# Patient Record
Sex: Male | Born: 2000 | Race: White | Hispanic: No | Marital: Single | Smoking: Never smoker
Health system: Southern US, Community
[De-identification: ages and names within clinical notes are randomized; demographics above are authoritative.]

---
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
## 2022-05-02 DIAGNOSIS — Z0000 Encounter for general adult medical examination without abnormal findings: Secondary | ICD-10-CM | POA: Diagnosis not present

## 2022-05-08 DIAGNOSIS — Z0000 Encounter for general adult medical examination without abnormal findings: Secondary | ICD-10-CM | POA: Diagnosis not present

## 2022-05-08 DIAGNOSIS — L659 Nonscarring hair loss, unspecified: Secondary | ICD-10-CM | POA: Diagnosis not present

## 2022-05-08 DIAGNOSIS — R82998 Other abnormal findings in urine: Secondary | ICD-10-CM | POA: Diagnosis not present

## 2022-09-27 DIAGNOSIS — S62346A Nondisplaced fracture of base of fifth metacarpal bone, right hand, initial encounter for closed fracture: Secondary | ICD-10-CM | POA: Diagnosis not present

## 2022-10-11 DIAGNOSIS — S62346A Nondisplaced fracture of base of fifth metacarpal bone, right hand, initial encounter for closed fracture: Secondary | ICD-10-CM | POA: Diagnosis not present

## 2022-10-11 DIAGNOSIS — M79641 Pain in right hand: Secondary | ICD-10-CM | POA: Diagnosis not present

## 2022-11-08 DIAGNOSIS — S62346A Nondisplaced fracture of base of fifth metacarpal bone, right hand, initial encounter for closed fracture: Secondary | ICD-10-CM | POA: Diagnosis not present

## 2023-03-20 DIAGNOSIS — M542 Cervicalgia: Secondary | ICD-10-CM | POA: Diagnosis not present

## 2023-03-20 DIAGNOSIS — R519 Headache, unspecified: Secondary | ICD-10-CM | POA: Diagnosis not present

## 2023-05-03 DIAGNOSIS — R7989 Other specified abnormal findings of blood chemistry: Secondary | ICD-10-CM | POA: Diagnosis not present

## 2023-05-21 DIAGNOSIS — Z1331 Encounter for screening for depression: Secondary | ICD-10-CM | POA: Diagnosis not present

## 2023-05-21 DIAGNOSIS — Z1339 Encounter for screening examination for other mental health and behavioral disorders: Secondary | ICD-10-CM | POA: Diagnosis not present

## 2023-05-21 DIAGNOSIS — Z23 Encounter for immunization: Secondary | ICD-10-CM | POA: Diagnosis not present

## 2023-05-21 DIAGNOSIS — Z0000 Encounter for general adult medical examination without abnormal findings: Secondary | ICD-10-CM | POA: Diagnosis not present

## 2023-05-25 DIAGNOSIS — J101 Influenza due to other identified influenza virus with other respiratory manifestations: Secondary | ICD-10-CM | POA: Diagnosis not present

## 2023-05-25 DIAGNOSIS — R509 Fever, unspecified: Secondary | ICD-10-CM | POA: Diagnosis not present

## 2023-12-05 DIAGNOSIS — R0789 Other chest pain: Secondary | ICD-10-CM | POA: Diagnosis not present
